# Patient Record
Sex: Male | Born: 1990 | Race: White | Hispanic: No | Marital: Married | Smoking: Never smoker
Health system: Southern US, Community
[De-identification: ages and names within clinical notes are randomized; demographics above are authoritative.]

## PROBLEM LIST (undated history)

## (undated) HISTORY — PX: WISDOM TOOTH EXTRACTION: SHX21

## (undated) HISTORY — PX: KIDNEY DONATION: SHX685

## (undated) HISTORY — PX: PILONIDAL CYST EXCISION: SHX744

---
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
## 2010-04-22 LAB — CBC
Hemoglobin: 15.6 g/dL (ref 13.0–17.0)
MCHC: 34.9 g/dL (ref 30.0–36.0)
Platelets: 228 10*3/uL (ref 150–400)
RBC: 4.79 MIL/uL (ref 4.22–5.81)
WBC: 6.9 10*3/uL (ref 4.0–10.5)

## 2011-02-03 IMAGING — US US ABDOMEN COMPLETE
1 series · 14 of 25 positions shown · non-contrast
Comparison: None

CLINICAL DATA: Fatty food intolerance

COMPLETE ABDOMINAL ULTRASOUND

[Series 1: us abdomen complete · 0.22mm/px · 14 of 92 slices shown]
[im 1/92]
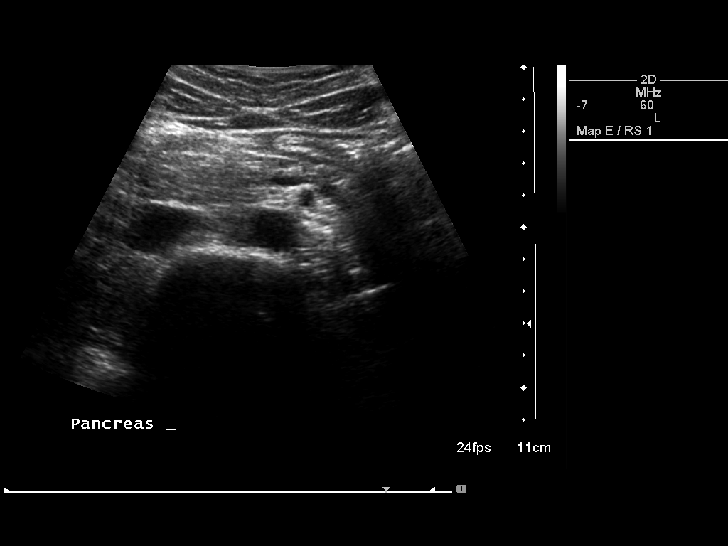
[im 8/92]
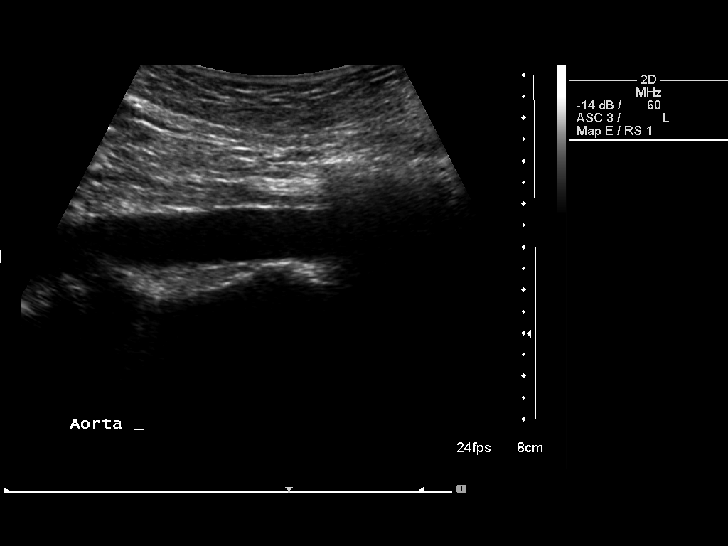
[im 16/92]
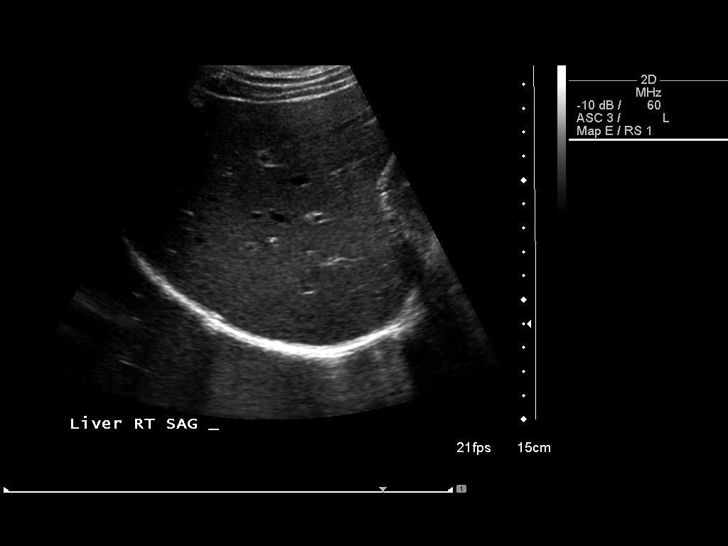
[im 23/92]
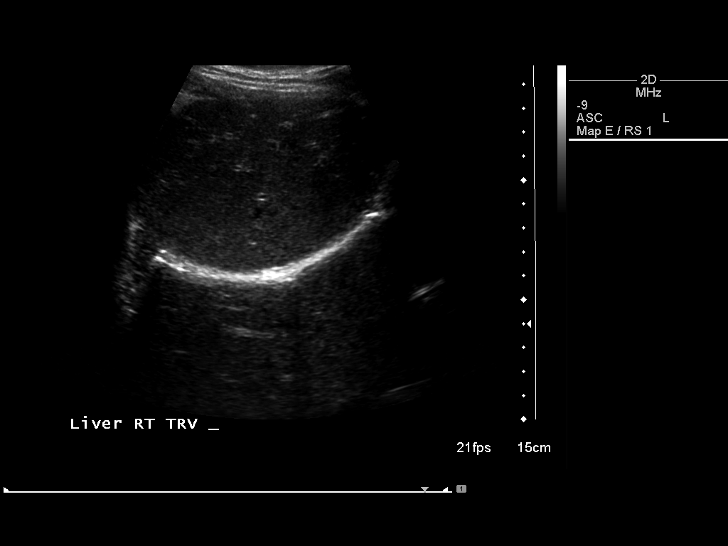
[im 31/92]
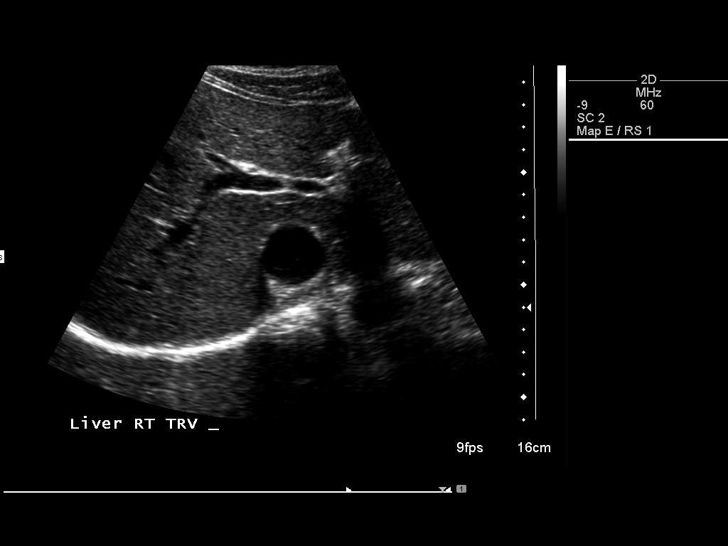
[im 35/92]
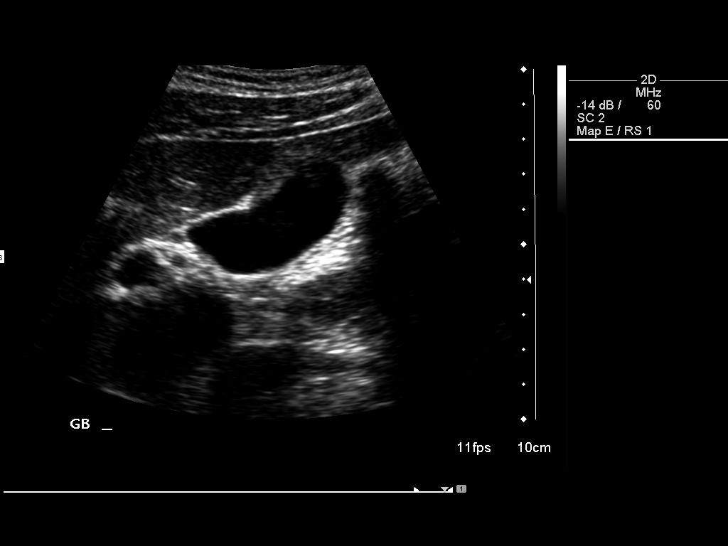
[im 42/92]
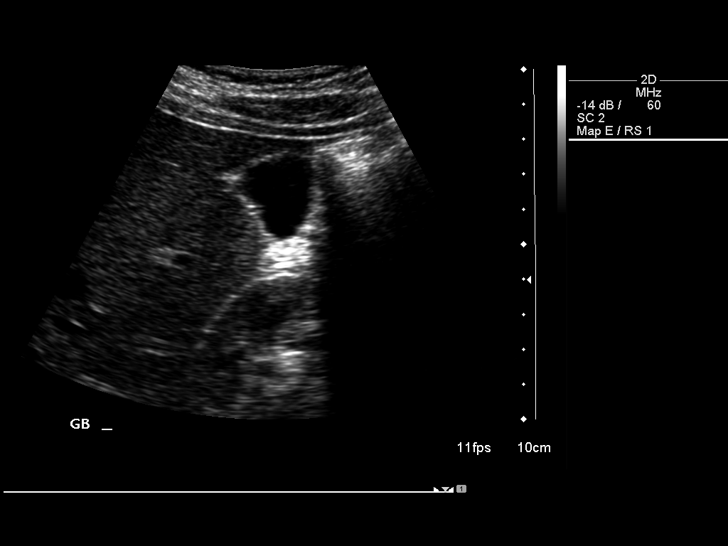
[im 50/92]
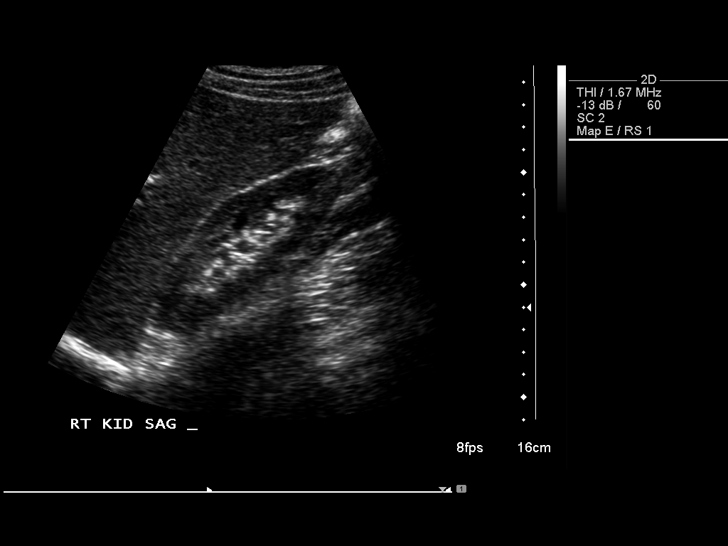
[im 57/92]
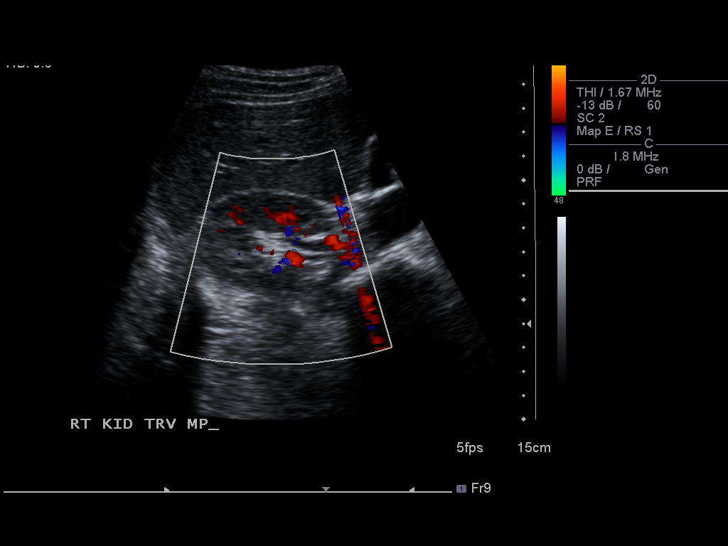
[im 61/92]
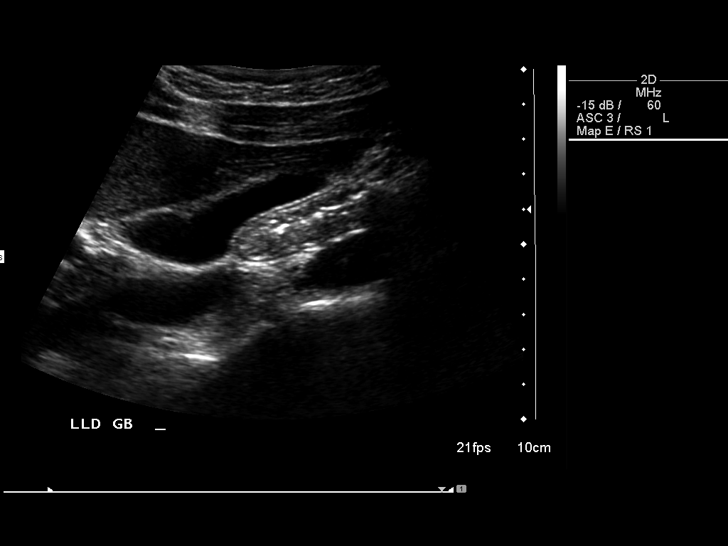
[im 69/92]
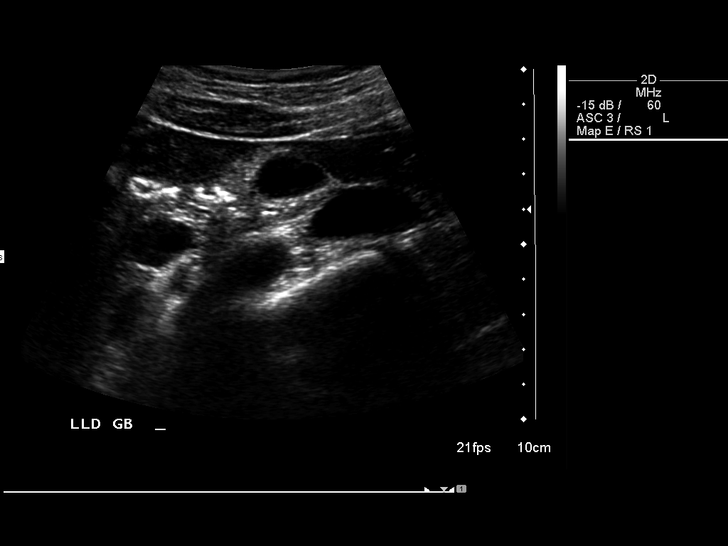
[im 76/92]
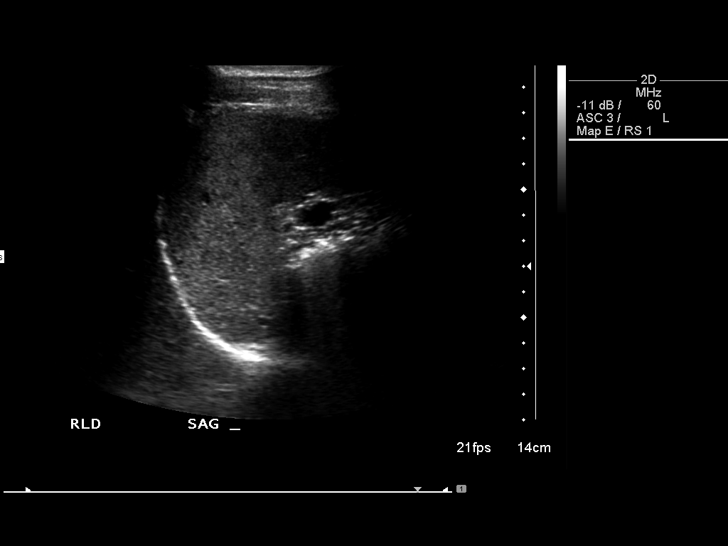
[im 84/92]
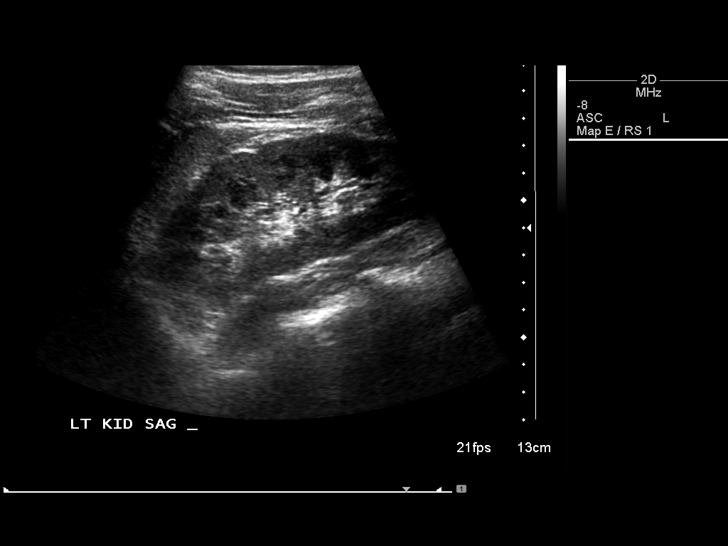
[im 92/92]
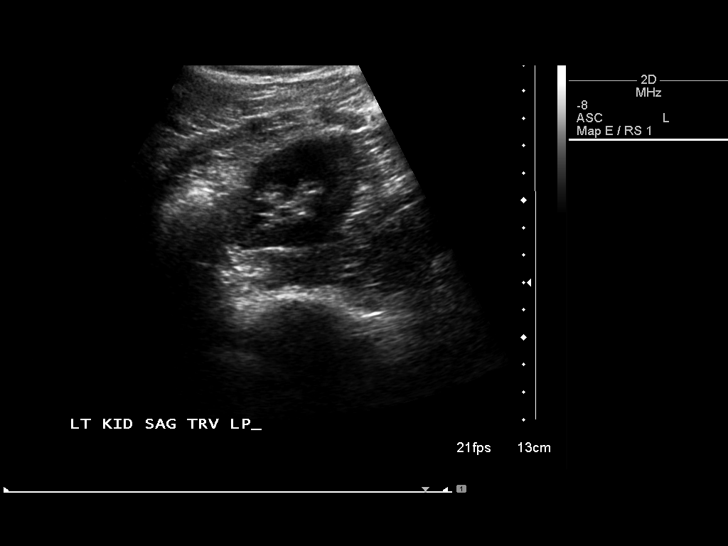

[14 of 25 positions shown; findings below may reference images not displayed]

FINDINGS: The liver is normal in contour and echogenicity.  The
gallbladder is thin-walled at 1.9 mm.  No evidence of
pericholecystic fluid.  No evidence of gallstones.  Negative
sonographic Murphy's sign.  Common bile duct is normal diameter
mm.

The IVC and pancreas appear normal.

The spleen is normal echogenicity measuring 8.2 cm.

Right kidney measures 10.7 cm.  Left kidney measures 11.1 cm. No
evidence of hydronephrosis.

Abdominal aorta is normal in diameter 1.8 cm.
IMPRESSION: No abdominal abnormality by ultrasound.

## 2017-04-28 ENCOUNTER — Ambulatory Visit: Payer: Self-pay | Admitting: Emergency Medicine

## 2017-04-28 VITALS — BP 122/70 | HR 67 | Temp 98.3°F | Resp 20

## 2017-04-28 DIAGNOSIS — N390 Urinary tract infection, site not specified: Secondary | ICD-10-CM

## 2017-04-28 LAB — POCT URINALYSIS DIPSTICK
Bilirubin, UA: NEGATIVE
Blood, UA: NEGATIVE
Glucose, UA: NEGATIVE
LEUKOCYTES UA: NEGATIVE
NITRITE UA: NEGATIVE
PH UA: 7.5 (ref 5.0–8.0)
PROTEIN UA: NEGATIVE
Spec Grav, UA: 1.02 (ref 1.010–1.025)
UROBILINOGEN UA: 0.2 U/dL

## 2017-04-28 MED ORDER — CIPROFLOXACIN HCL 500 MG PO TABS: 500.0000 mg | ORAL_TABLET | Freq: Two times a day (BID) | ORAL | 0 refills | Status: DC

## 2017-04-28 NOTE — Progress Notes (Signed)
S: Ryan Frank is a 27 y.o. male who presents with chief complaint of urinary frequency. Denies discharge, or hematuria, has had slight dysuria, and feelings of retention. Has had UTIs in the past. 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 He has had no increased thirst, hunger, or change in weight. Otherwise is in good health    Review of Systems  Constitutional: Positive for chills. Negative for fever and malaise/fatigue.  Gastrointestinal: Negative for abdominal pain, nausea and vomiting.  Genitourinary: Positive for dysuria, flank pain and frequency. Negative for hematuria and urgency.  Musculoskeletal: Negative for myalgias.  Neurological: Negative for dizziness and headaches.  Endo/Heme/Allergies: Negative for polydipsia.    O:  Vitals:   04/28/17 1740  BP: 122/70  Pulse: 67  Resp: 20  Temp: 98.3 F (36.8 C)  SpO2: 98%   Physical Exam  Constitutional: He appears well-developed and well-nourished.  HENT:  Head: Normocephalic and atraumatic.  Abdominal: Soft. There is no tenderness.  No cva tenderness  Genitourinary:  Genitourinary Comments: Deferred   Neurological: He is alert.  Skin: Skin is warm and dry. Capillary refill takes less than 2 seconds.  Nursing note and vitals reviewed.   A:  1. Urinary tract infection without hematuria, site unspecified     P:  Will treat for possible UTI, recommend following up with Urology is symptoms persist. Doubt endocrine cause such as diabetes. Recommend fluids, avoid caffeine containing products. ER if symptoms worsen.

## 2017-04-28 NOTE — Patient Instructions (Signed)

## 2017-05-08 ENCOUNTER — Telehealth: Payer: Self-pay

## 2019-03-08 NOTE — Telephone Encounter (Signed)
Error

## 2020-08-16 ENCOUNTER — Encounter: Payer: Self-pay | Admitting: Internal Medicine

## 2020-12-06 ENCOUNTER — Ambulatory Visit: Payer: Self-pay | Admitting: Gastroenterology

## 2021-03-15 ENCOUNTER — Ambulatory Visit: Payer: Self-pay | Admitting: *Deleted

## 2021-03-15 ENCOUNTER — Other Ambulatory Visit: Payer: Self-pay

## 2021-03-15 VITALS — BP 132/84 | Ht 71.0 in | Wt 202.0 lb

## 2021-03-15 DIAGNOSIS — Z0000 Encounter for general adult medical examination without abnormal findings: Secondary | ICD-10-CM

## 2021-03-15 NOTE — Progress Notes (Signed)
[employer] insurance premium discount evaluation: Labs Drawn. Replacements ROI form signed. Tobacco Free Attestation form signed.  Forms placed in paper chart.   Pt is a living kidney donor and was told his BP will likely be elevated chronically but pt reports it is normally well controlled and WNL.

## 2021-03-16 ENCOUNTER — Encounter: Payer: Self-pay | Admitting: Registered Nurse

## 2021-03-16 LAB — CMP12+LP+TP+TSH+6AC+CBC/D/PLT
ALT: 12 IU/L (ref 0–44)
AST: 16 IU/L (ref 0–40)
Albumin/Globulin Ratio: 2.4 — ABNORMAL HIGH (ref 1.2–2.2)
Albumin: 4.7 g/dL (ref 4.1–5.2)
Alkaline Phosphatase: 81 IU/L (ref 44–121)
BUN/Creatinine Ratio: 14 (ref 9–20)
BUN: 14 mg/dL (ref 6–20)
Basophils Absolute: 0 10*3/uL (ref 0.0–0.2)
Basos: 1 %
Bilirubin Total: 0.6 mg/dL (ref 0.0–1.2)
Calcium: 9.2 mg/dL (ref 8.7–10.2)
Chloride: 103 mmol/L (ref 96–106)
Chol/HDL Ratio: 4.3 ratio (ref 0.0–5.0)
Cholesterol, Total: 160 mg/dL (ref 100–199)
Creatinine, Ser: 0.98 mg/dL (ref 0.76–1.27)
EOS (ABSOLUTE): 0.2 10*3/uL (ref 0.0–0.4)
Eos: 3 %
Estimated CHD Risk: 0.8 times avg. (ref 0.0–1.0)
Free Thyroxine Index: 2.5 (ref 1.2–4.9)
GGT: 10 IU/L (ref 0–65)
Globulin, Total: 2 g/dL (ref 1.5–4.5)
Glucose: 77 mg/dL (ref 70–99)
HDL: 37 mg/dL — ABNORMAL LOW (ref >39)
Hematocrit: 43.6 % (ref 37.5–51.0)
Hemoglobin: 15.5 g/dL (ref 13.0–17.7)
Immature Grans (Abs): 0 10*3/uL (ref 0.0–0.1)
Immature Granulocytes: 0 %
Iron: 140 ug/dL (ref 38–169)
LDH: 152 IU/L (ref 121–224)
LDL Chol Calc (NIH): 100 mg/dL — ABNORMAL HIGH (ref 0–99)
Lymphocytes Absolute: 1.9 10*3/uL (ref 0.7–3.1)
Lymphs: 36 %
MCH: 32.2 pg (ref 26.6–33.0)
MCHC: 35.6 g/dL (ref 31.5–35.7)
MCV: 91 fL (ref 79–97)
Monocytes Absolute: 0.4 10*3/uL (ref 0.1–0.9)
Monocytes: 7 %
Neutrophils Absolute: 2.7 10*3/uL (ref 1.4–7.0)
Neutrophils: 53 %
Phosphorus: 2.8 mg/dL (ref 2.8–4.1)
Platelets: 202 10*3/uL (ref 150–450)
Potassium: 4.1 mmol/L (ref 3.5–5.2)
RBC: 4.82 x10E6/uL (ref 4.14–5.80)
RDW: 12.6 % (ref 11.6–15.4)
Sodium: 140 mmol/L (ref 134–144)
T3 Uptake Ratio: 27 % (ref 24–39)
T4, Total: 9.1 ug/dL (ref 4.5–12.0)
TSH: 1.43 u[IU]/mL (ref 0.450–4.500)
Total Protein: 6.7 g/dL (ref 6.0–8.5)
Triglycerides: 126 mg/dL (ref 0–149)
Uric Acid: 6 mg/dL (ref 3.8–8.4)
VLDL Cholesterol Cal: 23 mg/dL (ref 5–40)
WBC: 5.2 10*3/uL (ref 3.4–10.8)
eGFR: 106 mL/min/{1.73_m2} (ref >59)

## 2021-03-16 LAB — HGB A1C W/O EAG: Hgb A1c MFr Bld: 5.2 % (ref 4.8–5.6)

## 2022-02-24 DIAGNOSIS — Z6829 Body mass index (BMI) 29.0-29.9, adult: Secondary | ICD-10-CM | POA: Diagnosis not present

## 2022-02-24 DIAGNOSIS — K529 Noninfective gastroenteritis and colitis, unspecified: Secondary | ICD-10-CM | POA: Diagnosis not present

## 2022-02-25 ENCOUNTER — Encounter: Payer: Self-pay | Admitting: Internal Medicine

## 2022-03-20 NOTE — Progress Notes (Signed)
GI Office Note    Referring Provider: Lavella Lemons, PA Primary Care Physician:  Lavella Lemons, PA  Primary Gastroenterologist: Elon Alas. Abbey Chatters, DO    Chief Complaint   Chief Complaint  Patient presents with   Diarrhea    Has issues with loose stools. 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      History of Present Illness   Ryan Frank is a 32 y.o. male presenting today at the request of Aggie Hacker, PA-C for further evaluation of chronic diarrhea.  Patient reports longstanding history of loose stools, all of his life.  In the past he would have occasional "normal stool".  Most days he has 2-3, Bristol 6-7 stools. No melena, brbpr. No nocturnal stools. Often feels like stool is incomplete. Has to wipe several times and then few minutes later feels like stool present and goes back to wipe more. Has been significant enough that he installed a bidet at home.  He has mild abdominal cramping improves with bowel movement.  No weight loss.  He is often wondered if it was something he was eating making his stools like this.  He is try to laminating several things without any significant improvement.  He tried cutting back on wheat but it did not seem to make a difference.  When he eliminates coffee he decrease his stools by 1/day.  He does feel like he has alpha gal close he has not been officially diagnosed.  For the past 10 years when he eats mammalian meat several hours later he develops a rash, with severe episodes he has had drop in his blood pressure to the point that he has to stay sitting to avoid passing out.  Never developed throat swelling.  Usually takes Benadryl when this happens.  Has been able to control symptoms by avoidance of red meat.  Last severe episode about 5 years ago.  He has had lesser episodes since then with accidental ingestion of food that had been cooked in mammalian byproducts.  Seems to tolerate dairy.  Other forms of  cross-contamination such as cooking on the same grill with mammalian meat does not seem to bother him.     He has noticed that if he uses Metamucil, stool seem to firm up a little bit but then when he consumes the same amount of fiber through food, does not seem to help.  He does not want to have to use fiber supplements chronically.  No significant heartburn, sometimes with trigger foods.  No dysphagia.  No weight loss.  He donated left kidney to his sister at age 76.      Medications   No current outpatient medications on file.   No current facility-administered medications for this visit.    Allergies   Allergies as of 03/21/2022 - Review Complete 03/21/2022  Allergen Reaction Noted   Alpha-gal Diarrhea, Hives, Itching, Nausea Only, Other (See Comments), Rash, Shortness Of Breath, and Swelling 02/18/2010    Past Medical History   No past medical history on file.  Past Surgical History   Past Surgical History:  Procedure Laterality Date   KIDNEY DONATION Left    age 77,    Past Family History   Family History  Problem Relation Age of Onset   Cystic kidney disease Sister    Diabetes Maternal Grandmother    Colon cancer Neg Hx    Celiac disease Neg Hx    Inflammatory bowel disease  Neg Hx     Past Social History   Social History   Socioeconomic History   Marital status: Married    Spouse name: Not on file   Number of children: Not on file   Years of education: Not on file   Highest education level: Not on file  Occupational History   Not on file  Tobacco Use   Smoking status: Never    Passive exposure: Never   Smokeless tobacco: Never  Substance and Sexual Activity   Alcohol use: Not Currently   Drug use: Never   Sexual activity: Not Currently  Other Topics Concern   Not on file  Social History Narrative   Not on file   Social Determinants of Health   Financial Resource Strain: Not on file  Food Insecurity: Not on file  Transportation Needs:  Not on file  Physical Activity: Not on file  Stress: Not on file  Social Connections: Not on file  Intimate Partner Violence: Not on file    Review of Systems   General: Negative for anorexia, weight loss, fever, chills, fatigue, weakness. Eyes: Negative for vision changes.  ENT: Negative for hoarseness, difficulty swallowing , nasal congestion. CV: Negative for chest pain, angina, palpitations, dyspnea on exertion, peripheral edema.  Respiratory: Negative for dyspnea at rest, dyspnea on exertion, cough, sputum, wheezing.  GI: See history of present illness. GU:  Negative for dysuria, hematuria, urinary incontinence, urinary frequency, nocturnal urination.  MS: Negative for joint pain, low back pain.  Derm: Negative for rash or itching.  Neuro: Negative for weakness, abnormal sensation, seizure, frequent headaches, memory loss,  confusion.  Psych: Negative for anxiety, depression, suicidal ideation, hallucinations.  Endo: Negative for unusual weight change.  Heme: Negative for bruising or bleeding. Allergy: Negative for rash or hives.  Physical Exam   BP 121/79 (BP Location: Right Arm, Patient Position: Sitting, Cuff Size: Large)   Pulse 76   Temp 98 F (36.7 C) (Oral)   Ht '5\' 11"'$  (1.803 m)   Wt 211 lb 3.2 oz (95.8 kg)   SpO2 98%   BMI 29.46 kg/m    General: Well-nourished, well-developed in no acute distress.  Head: Normocephalic, atraumatic.   Eyes: Conjunctiva pink, no icterus. Mouth: Oropharyngeal mucosa moist and pink   Neck: Supple without thyromegaly, masses, or lymphadenopathy.  Lungs: Clear to auscultation bilaterally.  Heart: Regular rate and rhythm, no murmurs rubs or gallops.  Abdomen: Bowel sounds are normal, nontender, nondistended, no hepatosplenomegaly or masses,  no abdominal bruits or hernia, no rebound or guarding.   Rectal: not performed Extremities: No lower extremity edema. No clubbing or deformities.  Neuro: Alert and oriented x 4 , grossly  normal neurologically.  Skin: Warm and dry, no rash or jaundice.   Psych: Alert and cooperative, normal mood and affect.  Labs   No recent labs.   Imaging Studies   No results found.  Assessment   Chronic diarrhea: differential broad including Alpha-Gal, Celiac, IBS, less likely IBD or infectious. Previously had TTG IgA normal but IgA level low therefore needs screening for celiac with IgG levels. Having 2-3 Bristol 6-7 stools daily with sensation of incomplete stools, mild cramping. No nocturnal symptoms. No weight loss or blood in the stool. May require colonoscopy to further evaluate symptoms, based on lab findings.    PLAN   Start with labs to include TTG IgG, IgG, TTG IgA, IgA, Alpha-Gal, Sed rate, CRP, CBC, CMET, TSH/free T4.   Laureen Ochs. Bobby Rumpf, MHS, PA-C [facility]

## 2022-03-21 ENCOUNTER — Ambulatory Visit: Payer: BC Managed Care – PPO | Admitting: Gastroenterology

## 2022-03-21 ENCOUNTER — Encounter: Payer: Self-pay | Admitting: Gastroenterology

## 2022-03-21 VITALS — BP 121/79 | HR 76 | Temp 98.0°F | Ht 71.0 in | Wt 211.2 lb

## 2022-03-21 DIAGNOSIS — R197 Diarrhea, unspecified: Secondary | ICD-10-CM

## 2022-03-21 NOTE — Patient Instructions (Signed)
Please complete labs today at [facility] located at [address]. We will be in touch with results as available.  Based on results, we will provide additional recommendations.   It was a pleasure to see you today. I want to create trusting relationships with patients and provide genuine, compassionate, and quality care. I truly value your feedback, so please be on the lookout for a survey regarding your visit with me today. I appreciate your time in completing this!

## 2022-03-22 LAB — COMPREHENSIVE METABOLIC PANEL
Albumin/Globulin Ratio: 2.5 — ABNORMAL HIGH (ref 1.2–2.2)
Calcium: 9.5 mg/dL (ref 8.7–10.2)
Chloride: 106 mmol/L (ref 96–106)
Total Protein: 6.9 g/dL (ref 6.0–8.5)

## 2022-03-22 LAB — C-REACTIVE PROTEIN: CRP: <1 mg/L (ref 0–10)

## 2022-03-22 LAB — CBC WITH DIFFERENTIAL/PLATELET
EOS (ABSOLUTE): 0.3 10*3/uL (ref 0.0–0.4)
MCH: 31.8 pg (ref 26.6–33.0)
MCHC: 34.9 g/dL (ref 31.5–35.7)
MCV: 91 fL (ref 79–97)
Monocytes: 10 %
Platelets: 179 10*3/uL (ref 150–450)
RBC: 5 x10E6/uL (ref 4.14–5.80)

## 2022-03-22 LAB — ALPHA-GAL PANEL

## 2022-03-22 LAB — TSH+FREE T4: Free T4: 1.24 ng/dL (ref 0.82–1.77)

## 2022-03-22 LAB — IGA: IgA/Immunoglobulin A, Serum: 73 mg/dL — ABNORMAL LOW (ref 90–386)

## 2022-03-22 LAB — IGG: IgG (Immunoglobin G), Serum: 817 mg/dL (ref 603–1613)

## 2022-03-22 LAB — SEDIMENTATION RATE: Sed Rate: 3 mm/hr (ref 0–15)

## 2022-03-23 LAB — CBC WITH DIFFERENTIAL/PLATELET
Hematocrit: 45.6 % (ref 37.5–51.0)
Hemoglobin: 15.9 g/dL (ref 13.0–17.7)
Lymphocytes Absolute: 1.5 10*3/uL (ref 0.7–3.1)
Lymphs: 30 %
RDW: 12.6 % (ref 11.6–15.4)

## 2022-03-23 LAB — COMPREHENSIVE METABOLIC PANEL
AST: 16 IU/L (ref 0–40)
Albumin: 4.9 g/dL (ref 4.1–5.1)
Alkaline Phosphatase: 81 IU/L (ref 44–121)
BUN: 20 mg/dL (ref 6–20)
CO2: 22 mmol/L (ref 20–29)
Creatinine, Ser: 1.1 mg/dL (ref 0.76–1.27)
Globulin, Total: 2 g/dL (ref 1.5–4.5)
eGFR: 92 mL/min/{1.73_m2} (ref >59)

## 2022-03-23 LAB — TISSUE TRANSGLUTAMINASE, IGA: Transglutaminase IgA: <2 U/mL (ref 0–3)

## 2022-03-23 LAB — ALPHA-GAL PANEL

## 2022-03-25 LAB — COMPREHENSIVE METABOLIC PANEL
ALT: 18 IU/L (ref 0–44)
BUN/Creatinine Ratio: 18 (ref 9–20)
Bilirubin Total: 0.4 mg/dL (ref 0.0–1.2)
Glucose: 96 mg/dL (ref 70–99)
Potassium: 4.7 mmol/L (ref 3.5–5.2)
Sodium: 141 mmol/L (ref 134–144)

## 2022-03-25 LAB — TSH+FREE T4: TSH: 1.67 u[IU]/mL (ref 0.450–4.500)

## 2022-03-25 LAB — TISSUE TRANSGLUTAMINASE, IGG: Tissue Transglut Ab: <2 U/mL (ref 0–5)

## 2022-03-25 LAB — CBC WITH DIFFERENTIAL/PLATELET
Basophils Absolute: 0 10*3/uL (ref 0.0–0.2)
Basos: 1 %
Eos: 5 %
Immature Grans (Abs): 0 10*3/uL (ref 0.0–0.1)
Immature Granulocytes: 0 %
Monocytes Absolute: 0.5 10*3/uL (ref 0.1–0.9)
Neutrophils Absolute: 2.7 10*3/uL (ref 1.4–7.0)
Neutrophils: 54 %
WBC: 5 10*3/uL (ref 3.4–10.8)

## 2022-03-25 LAB — ALPHA-GAL PANEL
IgE (Immunoglobulin E), Serum: 49 IU/mL (ref 6–495)
O215-IgE Alpha-Gal: 6.8 kU/L — AB

## 2022-03-31 ENCOUNTER — Other Ambulatory Visit: Payer: Self-pay | Admitting: *Deleted

## 2022-03-31 DIAGNOSIS — Z91018 Allergy to other foods: Secondary | ICD-10-CM

## 2022-03-31 NOTE — Progress Notes (Signed)
Referral sent 

## 2022-04-08 ENCOUNTER — Other Ambulatory Visit: Payer: Self-pay | Admitting: *Deleted

## 2022-04-08 ENCOUNTER — Encounter: Payer: Self-pay | Admitting: *Deleted

## 2022-04-08 MED ORDER — PEG 3350-KCL-NA BICARB-NACL 420 G PO SOLR: 4000.0000 mL | Freq: Once | ORAL | 0 refills | Status: AC

## 2022-04-23 ENCOUNTER — Ambulatory Visit (INDEPENDENT_AMBULATORY_CARE_PROVIDER_SITE_OTHER): Payer: BC Managed Care – PPO | Admitting: Internal Medicine

## 2022-04-23 ENCOUNTER — Encounter: Payer: Self-pay | Admitting: Internal Medicine

## 2022-04-23 ENCOUNTER — Telehealth: Payer: Self-pay

## 2022-04-23 ENCOUNTER — Other Ambulatory Visit: Payer: Self-pay

## 2022-04-23 VITALS — BP 110/84 | HR 83 | Temp 98.1°F | Resp 16 | Ht 71.26 in | Wt 214.2 lb

## 2022-04-23 DIAGNOSIS — J3089 Other allergic rhinitis: Secondary | ICD-10-CM

## 2022-04-23 DIAGNOSIS — Z91018 Allergy to other foods: Secondary | ICD-10-CM

## 2022-04-23 DIAGNOSIS — K9049 Malabsorption due to intolerance, not elsewhere classified: Secondary | ICD-10-CM

## 2022-04-23 DIAGNOSIS — J302 Other seasonal allergic rhinitis: Secondary | ICD-10-CM | POA: Diagnosis not present

## 2022-04-23 DIAGNOSIS — J343 Hypertrophy of nasal turbinates: Secondary | ICD-10-CM | POA: Diagnosis not present

## 2022-04-23 MED ORDER — AZELASTINE HCL 0.1 % NA SOLN: 1.0000 | Freq: Two times a day (BID) | NASAL | 5 refills | Status: AC | PRN

## 2022-04-23 MED ORDER — CETIRIZINE HCL 10 MG PO TABS: 10.0000 mg | ORAL_TABLET | Freq: Every day | ORAL | 5 refills | Status: AC | PRN

## 2022-04-23 MED ORDER — EPINEPHRINE 0.3 MG/0.3ML IJ SOAJ: 0.3000 mg | INTRAMUSCULAR | 1 refills | Status: AC | PRN

## 2022-04-23 NOTE — Telephone Encounter (Signed)
Lab orders were given to patient to give to [facility] to get his blood drawn, but new lab orders with the correct billing was corrected. The new lab requests have been completed.

## 2022-04-23 NOTE — Telephone Encounter (Signed)
Selma from [facility] called and said the order was put in wrong for [facility]. It was put in as employee health and it needs to be put in as [place], [place] name and patients name. Her phone # is: [phone]

## 2022-04-23 NOTE — Patient Instructions (Addendum)
Alpha Gal Allergy  - please strictly avoid all mammalian meat. - for SKIN only reaction, okay to take Benadryl 25mg  capsules every 6 hours - for SKIN + ANY additional symptoms, OR IF concern for LIFE THREATENING reaction = Epipen Autoinjector EpiPen 0.3 mg. - If using Epinephrine autoinjector, call 911 or go to the ER.   Food Sensitivity - Look into low FODMAP diet - There is no testing available for food sensitivity. It is through trial and error that you figure out what foods are causing your symptoms and to avoid them completely or eat them in small amounts.   Allergic Rhinitis: - Due to turbinate hypertrophy, seasonal symptoms and unresponsive to OTC meds, performed skin testing to identify aeroallergen triggers.   - Positive skin test 03/2022: trees, mold, cat, dog, dust mite, cockroach  - Avoidance measures discussed. - Use nasal saline rinses before nose sprays such as with Neilmed Sinus Rinse.  Use distilled water.   - Use Azelastine 1-2 sprays each nostril twice daily as needed for runny nose, congestion, drainage, sneezing. . Aim upward and outward. - Use Zyrtec 10 mg daily as needed for runny nose, sneezing, itchy watery eyes. - Consider allergy shots as long term control of your symptoms by teaching your immune system to be more tolerant of your allergy triggers.

## 2022-04-23 NOTE — Progress Notes (Signed)
NEW PATIENT  Date of Service/Encounter:  04/23/22  Consult requested by: Lavella Lemons, PA   Subjective:   Ryan Frank (DOB: 09/25/1990) is a 32 y.o. male who presents to the clinic on 04/23/2022 with a chief complaint of Alpha Gal (Has had alpha gal for about 10-15 years. Does not have an epipen. ), Allergic Rhinitis , and Allergy Testing (Sensitivities to foods ) .    History obtained from: chart review and patient.   Alpha Gal Symptoms happened over 10 years ago but was just recently diagnosed with alpha gal.  However, he figured this was the diagnosis so was trying to avoid red meat.  Reports several hours after red meat ingestion, he would get itchy all over, break out in a rash similar to hives, get abdominal pain/nausea and feel like he was going to pass out like his BP was low.  Does not have an Epipen.  He saw GI recently and they got an alpha gal IgE that was elevated.  Last accidental exposure was 1 year ago when he was at a [place] and ate beans by mistake with pork.    Food Sensitivity Reports having trouble with certain foods and having chronic diarrhea for years.  Beans, legumes, wheat, ice cream, pizza cause worsened symptoms. He is followed by GI.  Celiac screen was negative.  He is due to undergo colonoscopy.  Does wonder if this is IBS.  Has not tried low FODMAP diet.  He is wondering if this is food sensitivity and would like to get tested for it.   Rhinitis:  Started in childhood and was worse then but improved now.   Symptoms include: nasal congestion, rhinorrhea, post nasal drainage, and sneezing  Occurs seasonally-Spring Potential triggers: pollen  Treatments tried:  Previously on anti-histamines but nothing recently  Previous allergy testing: no History of reflux/heartburn: no History of sinus surgery: no Nonallergic triggers: none     Past Medical History: History reviewed. No pertinent past medical history.  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 32,    Family History: Family History  Problem Relation Age of Onset   Cystic kidney disease Sister    Diabetes Maternal Grandmother    Asthma Nephew    Colon cancer Neg Hx    Celiac disease Neg Hx    Inflammatory bowel disease Neg Hx     Social History:  Lives in a 45 year house Flooring in bedroom: wood Pets: Neurosurgeon and dog Tobacco use/exposure: none Job: IT systems admin  Medication List:  Allergies as of 04/23/2022       Reactions   Alpha-gal Diarrhea, Hives, Itching, Nausea Only, Other (See Comments), Rash, Shortness Of Breath, Swelling        Medication List        Accurate as of April 23, 2022 12:12 PM. If you have any questions, ask your nurse or doctor.          azelastine 0.1 % nasal spray Commonly known as: ASTELIN Place 1 spray into both nostrils 2 (two) times daily as needed for rhinitis. Use in each nostril as directed Started by: Larose Kells, MD   cetirizine 10 MG tablet Commonly known as: ZyrTEC Allergy Take 1 tablet (10 mg total) by mouth daily as needed for allergies. Started by: Larose Kells, MD   EPINEPHrine 0.3 mg/0.3 mL Soaj injection Commonly known as: EpiPen 2-Pak Inject 0.3  mg into the muscle as needed for anaphylaxis. Started by: Larose Kells, MD         REVIEW OF SYSTEMS: Pertinent positives and negatives discussed in HPI.   Objective:   Physical Exam: BP 110/84   Pulse 83   Temp 98.1 F (36.7 C)   Resp 16   Ht 5' 11.26" (1.81 m)   Wt 214 lb 4 oz (97.2 kg)   SpO2 97%   BMI 29.66 kg/m  Body mass index is 29.66 kg/m. GEN: alert, well developed HEENT: clear conjunctiva, TM grey and translucent, nose with + inferior turbinate hypertrophy, pink nasal mucosa, slight clear rhinorrhea, + cobblestoning HEART: regular rate and rhythm, no murmur LUNGS: clear to auscultation bilaterally, no coughing, unlabored respiration ABDOMEN:  soft, non distended  SKIN: no rashes or lesions  Reviewed:  03/21/2022: seen by Bobby Rumpf PA for chronic diarrhea, also concern for alpha gal as mammalian meat ingestion caused delayed low BP and rash.  Alpha gal IgE 6.8. IgA slightly low 73. IgA/IgG TTG negative.   Skin Testing:  Skin prick testing was placed, which includes aeroallergens/foods, histamine control, and saline control.  Verbal consent was obtained prior to placing test.  Patient tolerated procedure well.  Allergy testing results were read and interpreted by myself, documented by clinical staff. Adequate positive and negative control.  Results discussed with patient/family.  Airborne Adult Perc - 04/23/22 0934     Time Antigen Placed [id number]    Allergen Manufacturer Lavella Hammock    Location Back    Number of Test 59    1. Control-Buffer 50% Glycerol Negative    2. Control-Histamine 1 mg/ml 2+    3. Albumin saline Negative    4. [place] Negative    5. [place] Negative    6. Johnson Negative    7. [place] Blue Negative    8. Meadow Fescue Negative    9. Perennial Rye Negative    10. Sweet Vernal Negative    11. Timothy Negative    12. Cocklebur Negative    13. Burweed Marshelder Negative    14. Ragweed, short Negative    15. Ragweed, Giant Negative    16. Plantain,  English Negative    17. Lamb's Quarters Negative    18. Sheep Sorrell Negative    19. Rough Pigweed Negative    20. Marsh Elder, Rough Negative    21. Mugwort, Common Negative    22. Ash mix Negative    23. Birch mix Negative    24. Beech American Negative    25. Box, Elder Negative    26. Cedar, red Negative    27. Cottonwood, [place] Negative    28. Elm mix Negative    29. Hickory 3+    30. Maple mix 3+    31. Oak, [place] mix 3+    32. Pecan Pollen Negative    33. Pine mix 3+    34. Sycamore Eastern Negative    35. [place], Black Pollen Negative    36. Alternaria alternata Negative    37. Cladosporium Herbarum Negative    38. Aspergillus mix Negative     39. Penicillium mix 2+    40. Bipolaris sorokiniana (Helminthosporium) Negative    41. Drechslera spicifera (Curvularia) 2+    42. Mucor plumbeus 2+    43. Fusarium moniliforme 2+    44. Aureobasidium pullulans (pullulara) Negative    45. Rhizopus oryzae Negative    46. Botrytis cinera Negative    47. Epicoccum nigrum Negative  48. Phoma betae Negative    49. Candida Albicans Negative    50. Trichophyton mentagrophytes 2+    51. Mite, D Farinae  5,000 AU/ml 3+    52. Mite, D Pteronyssinus  5,000 AU/ml 3+    53. Cat Hair 10,000 BAU/ml Negative    54.  Dog Epithelia Negative    55. Mixed Feathers Negative    56. Horse Epithelia Negative    57. Cockroach, German 2+    58. Mouse Negative    59. Tobacco Leaf Negative             Intradermal - 04/23/22 1028     Time Antigen Placed 1005    Allergen Manufacturer Greer    Location Arm    Number of Test 9    Control Negative    [place] Negative    Johnson Negative    7 Grass Negative    Ragweed mix Negative    Weed mix Negative    Mold 1 3+    Cat 3+    Dog 3+               Assessment:   1. Allergy to alpha-gal   2. Food intolerance   3. Nasal turbinate hypertrophy   4. Seasonal and perennial allergic rhinitis     Plan/Recommendations:  Alpha Gal Allergy  - please strictly avoid all mammalian meat. - for SKIN only reaction, okay to take Benadryl 25mg  capsules every 6 hours - for SKIN + ANY additional symptoms, OR IF concern for LIFE THREATENING reaction = Epipen Autoinjector EpiPen 0.3 mg. - If using Epinephrine autoinjector, call 911 or go to the ER.   Food Sensitivity - Look into low FODMAP diet - There is no testing available for food sensitivity. It is through trial and error that you figure out what foods are causing your symptoms and to avoid them completely or eat them in small amounts.  - Food SPT is not indicated based on your symptoms as they are not IgE mediated.   Allergic Rhinitis: - Due  to turbinate hypertrophy and seasonal symptoms, performed skin testing to identify aeroallergen triggers.   - Positive skin test 03/2022: trees, mold, cat, dog, dust mite, cockroach  - Avoidance measures discussed. - Use nasal saline rinses before nose sprays such as with Neilmed Sinus Rinse.  Use distilled water.   - Use Azelastine 1-2 sprays each nostril twice daily as needed for runny nose, congestion, drainage, sneezing.  Aim upward and outward. - Use Zyrtec 10 mg daily as needed for runny nose, sneezing, itchy watery eyes..  - Consider allergy shots as long term control of your symptoms by teaching your immune system to be more tolerant of your allergy triggers     Return in about 4 months (around 08/23/2022).  Harlon Flor, MD [facility]

## 2022-04-24 NOTE — Telephone Encounter (Signed)
Lab work and orders were not done by this office.   Patient will need to contact that office for correction.

## 2022-05-06 ENCOUNTER — Ambulatory Visit (HOSPITAL_COMMUNITY): Payer: BC Managed Care – PPO | Admitting: Anesthesiology

## 2022-05-06 ENCOUNTER — Other Ambulatory Visit: Payer: Self-pay

## 2022-05-06 ENCOUNTER — Encounter (HOSPITAL_COMMUNITY): Payer: Self-pay

## 2022-05-06 ENCOUNTER — Ambulatory Visit (HOSPITAL_COMMUNITY)
Admission: RE | Admit: 2022-05-06 | Discharge: 2022-05-06 | Disposition: A | Discharge status: Home / Self Care | Payer: BC Managed Care – PPO | Attending: Internal Medicine | Admitting: Internal Medicine

## 2022-05-06 ENCOUNTER — Encounter (HOSPITAL_COMMUNITY)
Admission: RE | Disposition: A | Discharge status: Home / Self Care | Payer: Self-pay | Source: Home / Self Care | Attending: Internal Medicine

## 2022-05-06 DIAGNOSIS — K529 Noninfective gastroenteritis and colitis, unspecified: Secondary | ICD-10-CM | POA: Diagnosis not present

## 2022-05-06 HISTORY — PX: BIOPSY: SHX5522

## 2022-05-06 HISTORY — PX: COLONOSCOPY WITH PROPOFOL: SHX5780

## 2022-05-06 SURGERY — COLONOSCOPY WITH PROPOFOL
Anesthesia: General

## 2022-05-06 MED ORDER — LIDOCAINE HCL (CARDIAC) PF 100 MG/5ML IV SOSY
PREFILLED_SYRINGE | INTRAVENOUS | Status: DC | PRN
  Administered 2022-05-06: 50 mg via INTRAVENOUS

## 2022-05-06 MED ORDER — STERILE WATER FOR IRRIGATION IR SOLN
Status: DC | PRN
  Administered 2022-05-06: 150 mL

## 2022-05-06 MED ORDER — PROPOFOL 500 MG/50ML IV EMUL
INTRAVENOUS | Status: DC | PRN
  Administered 2022-05-06: 200 ug/kg/min via INTRAVENOUS

## 2022-05-06 MED ORDER — LACTATED RINGERS IV SOLN: INTRAVENOUS | Status: DC

## 2022-05-06 MED ORDER — PROPOFOL 10 MG/ML IV BOLUS
INTRAVENOUS | Status: DC | PRN
  Administered 2022-05-06: 30 mg via INTRAVENOUS
  Administered 2022-05-06: 100 mg via INTRAVENOUS

## 2022-05-06 NOTE — Anesthesia Preprocedure Evaluation (Signed)
Anesthesia Evaluation  Patient identified by MRN, date of birth, ID band Patient awake    Reviewed: Allergy & Precautions, H&P , NPO status , Patient's Chart, lab work & pertinent test results, reviewed documented beta blocker date and time   Airway Mallampati: II  TM Distance: >3 FB Neck ROM: full    Dental no notable dental hx.    Pulmonary neg pulmonary ROS   Pulmonary exam normal breath sounds clear to auscultation       Cardiovascular Exercise Tolerance: Good negative cardio ROS  Rhythm:regular Rate:Normal     Neuro/Psych negative neurological ROS  negative psych ROS   GI/Hepatic negative GI ROS, Neg liver ROS,,,  Endo/Other  negative endocrine ROS    Renal/GU negative Renal ROS  negative genitourinary   Musculoskeletal   Abdominal   Peds  Hematology negative hematology ROS (+)   Anesthesia Other Findings   Reproductive/Obstetrics negative OB ROS                             Anesthesia Physical Anesthesia Plan  ASA: 1  Anesthesia Plan: General   Post-op Pain Management:    Induction:   PONV Risk Score and Plan: Propofol infusion  Airway Management Planned:   Additional Equipment:   Intra-op Plan:   Post-operative Plan:   Informed Consent: I have reviewed the patients History and Physical, chart, labs and discussed the procedure including the risks, benefits and alternatives for the proposed anesthesia with the patient or authorized representative who has indicated his/her understanding and acceptance.     Dental Advisory Given  Plan Discussed with: CRNA  Anesthesia Plan Comments:        Anesthesia Quick Evaluation  

## 2022-05-06 NOTE — H&P (Signed)
Primary Care Physician:  Lovey Newcomer, PA Primary Gastroenterologist:  Dr. Marletta Lor  Pre-Procedure History & Physical: HPI:  Ryan Frank is a 32 y.o. male is here for a colonoscopy for chronic diarrhea  History reviewed. No pertinent past medical history.  Past Surgical History:  Procedure Laterality Date   KIDNEY DONATION Left    age 61,   PILONIDAL CYST EXCISION     WISDOM TOOTH EXTRACTION      Prior to Admission medications   Medication Sig Start Date End Date Taking? Authorizing Provider  cetirizine (ZYRTEC ALLERGY) 10 MG tablet Take 1 tablet (10 mg total) by mouth daily as needed for allergies. 04/23/22  Yes Patel, Ellen Henri, MD  azelastine (ASTELIN) 0.1 % nasal spray Place 1 spray into both nostrils 2 (two) times daily as needed for rhinitis. Use in each nostril as directed 04/23/22   Birder Robson, MD  EPINEPHrine (EPIPEN 2-PAK) 0.3 mg/0.3 mL IJ SOAJ injection Inject 0.3 mg into the muscle as needed for anaphylaxis. 04/23/22   Birder Robson, MD    Allergies as of 04/08/2022 - Review Complete 03/21/2022  Allergen Reaction Noted   Alpha-gal Diarrhea, Hives, Itching, Nausea Only, Other (See Comments), Rash, Shortness Of Breath, and Swelling 02/18/2010    Family History  Problem Relation Age of Onset   Cystic kidney disease Sister    Diabetes Maternal Grandmother    Asthma Nephew    Colon cancer Neg Hx    Celiac disease Neg Hx    Inflammatory bowel disease Neg Hx     Social History   Socioeconomic History   Marital status: Married    Spouse name: Not on file   Number of children: Not on file   Years of education: Not on file   Highest education level: Not on file  Occupational History   Not on file  Tobacco Use   Smoking status: Never    Passive exposure: Never   Smokeless tobacco: Never  Vaping Use   Vaping Use: Never used  Substance and Sexual Activity   Alcohol use: Not Currently   Drug use: Never   Sexual activity: Not Currently  Other Topics Concern    Not on file  Social History Narrative   Not on file   Social Determinants of Health   Financial Resource Strain: Not on file  Food Insecurity: Not on file  Transportation Needs: Not on file  Physical Activity: Not on file  Stress: Not on file  Social Connections: Not on file  Intimate Partner Violence: Not on file    Review of Systems: See HPI, otherwise negative ROS  Physical Exam: Vital signs in last 24 hours: Temp:  [98 F (36.7 C)] 98 F (36.7 C) (04/09 0847) Pulse Rate:  [70] 70 (04/09 0847) BP: (130)/(80) 130/80 (04/09 0847) SpO2:  [99 %] 99 % (04/09 0847)   General:   Alert,  Well-developed, well-nourished, pleasant and cooperative in NAD Head:  Normocephalic and atraumatic. Eyes:  Sclera clear, no icterus.   Conjunctiva pink. Ears:  Normal auditory acuity. Nose:  No deformity, discharge,  or lesions. Msk:  Symmetrical without gross deformities. Normal posture. Extremities:  Without clubbing or edema. Neurologic:  Alert and  oriented x4;  grossly normal neurologically. Skin:  Intact without significant lesions or rashes. Psych:  Alert and cooperative. Normal mood and affect.  Impression/Plan: Ryan Frank is here for a colonoscopy to be performed for chronic diarrhea  The risks of the procedure including infection, bleed,  or perforation as well as benefits, limitations, alternatives and imponderables have been reviewed with the patient. Questions have been answered. All parties agreeable.

## 2022-05-06 NOTE — Discharge Instructions (Addendum)
  Colonoscopy Discharge Instructions  Read the instructions outlined below and refer to this sheet in the next few weeks. These discharge instructions provide you with general information on caring for yourself after you leave the hospital. Your doctor may also give you specific instructions. While your treatment has been planned according to the most current medical practices available, unavoidable complications occasionally occur.   ACTIVITY You may resume your regular activity, but move at a slower pace for the next 24 hours.  Take frequent rest periods for the next 24 hours.  Walking will help get rid of the air and reduce the bloated feeling in your belly (abdomen).  No driving for 24 hours (because of the medicine (anesthesia) used during the test).   Do not sign any important legal documents or operate any machinery for 24 hours (because of the anesthesia used during the test).  NUTRITION Drink plenty of fluids.  You may resume your normal diet as instructed by your doctor.  Begin with a light meal and progress to your normal diet. Heavy or fried foods are harder to digest and may make you feel sick to your stomach (nauseated).  Avoid alcoholic beverages for 24 hours or as instructed.  MEDICATIONS You may resume your normal medications unless your doctor tells you otherwise.  WHAT YOU CAN EXPECT TODAY Some feelings of bloating in the abdomen.  Passage of more gas than usual.  Spotting of blood in your stool or on the toilet paper.  IF YOU HAD POLYPS REMOVED DURING THE COLONOSCOPY: No aspirin products for 7 days or as instructed.  No alcohol for 7 days or as instructed.  Eat a soft diet for the next 24 hours.  FINDING OUT THE RESULTS OF YOUR TEST Not all test results are available during your visit. If your test results are not back during the visit, make an appointment with your caregiver to find out the results. Do not assume everything is normal if you have not heard from your  caregiver or the medical facility. It is important for you to follow up on all of your test results.  SEEK IMMEDIATE MEDICAL ATTENTION IF: You have more than a spotting of blood in your stool.  Your belly is swollen (abdominal distention).  You are nauseated or vomiting.  You have a temperature over 101.  You have abdominal pain or discomfort that is severe or gets worse throughout the day.   Overall, your colon appeared very healthy.  I did not see any active inflammation indicative of underlying inflammatory bowel disease such as Crohn's disease or ulcerative colitis.  The end portion of your small bowel called the terminal ileum also appeared normal.  I did take samples throughout your colon.  Await pathology results, office will contact you.  I did not find any polyps or evidence of colon cancer.  Recommend repeat colonoscopy for colon cancer screening purposes at age 73.  Follow-up in GI clinic in 2 to 3 months.  I hope you have a great rest of your week!  Hennie Duos. Marletta Lor, D.O. Gastroenterology and Hepatology [facility]

## 2022-05-06 NOTE — Transfer of Care (Signed)
Immediate Anesthesia Transfer of Care Note  Patient: ABDOU DOTTERY  Procedure(s) Performed: COLONOSCOPY WITH PROPOFOL  Patient Location: Endoscopy Unit  Anesthesia Type:General  Level of Consciousness: drowsy  Airway & Oxygen Therapy: Patient Spontanous Breathing  Post-op Assessment: Report given to RN and Post -op Vital signs reviewed and stable  Post vital signs: Reviewed and stable  Last Vitals:  Vitals Value Taken Time  BP 88/54 05/06/22 1013  Temp    Pulse 70 05/06/22 1013  Resp 14   SpO2 95 % 05/06/22 1013    Last Pain:  Vitals:   05/06/22 1013  TempSrc: Oral  PainSc: 0-No pain      Patients Stated Pain Goal: 4 (05/06/22 0847)  Complications: No notable events documented.

## 2022-05-06 NOTE — Op Note (Signed)
[facility] Patient Name: Ryan Frank Procedure Date: 05/06/2022 9:42 AM MRN: [id number] Date of Birth: 08/25/1990 Attending MD: Hennie Duos. Marletta Lor , [place], [id number] CSN: [id number] Age: 32 Admit Type: Outpatient Procedure:                Colonoscopy Indications:              Chronic diarrhea Providers:                Hennie Duos. Marletta Lor, DO, Buel Ream. Museum/gallery exhibitions officer, Charity fundraiser,                            Judeth Cornfield. Jessee Avers, Technician Referring MD:              Medicines:                See the Anesthesia note for documentation of the                            administered medications Complications:            No immediate complications. Estimated Blood Loss:     Estimated blood loss was minimal. Procedure:                Pre-Anesthesia Assessment:                           - The anesthesia plan was to use monitored                            anesthesia care (MAC).                           After obtaining informed consent, the colonoscope                            was passed under direct vision. Throughout the                            procedure, the patient's blood pressure, pulse, and                            oxygen saturations were monitored continuously. The                            [id number]-[id number] ([id number]) was introduced through the                            anus and advanced to the the terminal ileum, with                            identification of the appendiceal orifice and IC                            valve. The colonoscopy was performed without                            difficulty. The patient tolerated  the procedure                            well. The quality of the bowel preparation was                            evaluated using the BBPS [facility]([place] Bowel Preparation                            Scale) with scores of: Right Colon = 3, Transverse                            Colon = 3 and Left Colon = 3 (entire mucosa seen                            well with no residual  staining, small fragments of                            stool or opaque liquid). The total BBPS score                            equals 9. Scope In: 9:55:46 AM Scope Out: 10:09:54 AM Scope Withdrawal Time: 0 hours 11 minutes 19 seconds  Total Procedure Duration: 0 hours 14 minutes 8 seconds  Findings:      The terminal ileum appeared normal.      The colon (entire examined portion) appeared normal.      Biopsies were taken with a cold forceps in the descending colon, in the       transverse colon and in the ascending colon for histology. Impression:               - The examined portion of the ileum was normal.                           - The entire examined colon is normal.                           - Biopsies were taken with a cold forceps for                            histology in the descending colon, in the                            transverse colon and in the ascending colon. Moderate Sedation:      Per Anesthesia Care Recommendation:           - Patient has a contact number available for                            emergencies. The signs and symptoms of potential                            delayed complications were discussed with the  patient. Return to normal activities tomorrow.                            Written discharge instructions were provided to the                            patient.                           - Resume previous diet.                           - Continue present medications.                           - Await pathology results.                           - Repeat colonoscopy at age 54 or sooner if higher                            risk for screening purposes.                           - Return to GI clinic in 3 months.                           - Symptoms likely combination of IBS and Alpha gal Procedure Code(s):        --- Professional ---                           [phone], Colonoscopy, flexible; with biopsy, single                             or multiple Diagnosis Code(s):        --- Professional ---                           K52.9, Noninfective gastroenteritis and colitis,                            unspecified CPT copyright 2022 American Medical Association. All rights reserved. The codes documented in this report are preliminary and upon coder review may  be revised to meet current compliance requirements. Hennie Duos. Marletta Lor, DO Hennie Duos. Marletta Lor, DO 05/06/2022 10:12:42 AM This report has been signed electronically. Number of Addenda: 0

## 2022-05-07 LAB — SURGICAL PATHOLOGY

## 2022-05-08 NOTE — Anesthesia Postprocedure Evaluation (Signed)
Anesthesia Post Note  Patient: Ryan Frank  Procedure(s) Performed: COLONOSCOPY WITH PROPOFOL BIOPSY  Patient location during evaluation: Phase II Anesthesia Type: General Level of consciousness: awake Pain management: pain level controlled Vital Signs Assessment: post-procedure vital signs reviewed and stable Respiratory status: spontaneous breathing and respiratory function stable Cardiovascular status: blood pressure returned to baseline and stable Postop Assessment: no headache and no apparent nausea or vomiting Anesthetic complications: no Comments: Late entry   No notable events documented.   Last Vitals:  Vitals:   05/06/22 1013 05/06/22 1017  BP: (!) 88/54 (!) 97/57  Pulse: 70   Temp:    SpO2: 95%     Last Pain:  Vitals:   05/06/22 1013  TempSrc: Oral  PainSc: 0-No pain                 Windell Norfolk

## 2022-05-13 ENCOUNTER — Encounter (HOSPITAL_COMMUNITY): Payer: Self-pay | Admitting: Internal Medicine

## 2022-06-09 ENCOUNTER — Encounter: Payer: Self-pay | Admitting: Internal Medicine

## 2022-06-19 DIAGNOSIS — Z363 Encounter for antenatal screening for malformations: Secondary | ICD-10-CM | POA: Diagnosis not present

## 2022-06-19 DIAGNOSIS — Z3A19 19 weeks gestation of pregnancy: Secondary | ICD-10-CM | POA: Diagnosis not present
